# Patient Record
Sex: Female | Born: 1975 | Hispanic: Yes | Marital: Single | Smoking: Never smoker
Health system: Southern US, Community
[De-identification: ages and names within clinical notes are randomized; demographics above are authoritative.]

## PROBLEM LIST (undated history)

## (undated) DIAGNOSIS — E7800 Pure hypercholesterolemia, unspecified: Secondary | ICD-10-CM

## (undated) HISTORY — PX: MIDDLE EAR SURGERY: SHX713

---

## 2000-10-12 ENCOUNTER — Ambulatory Visit (HOSPITAL_COMMUNITY): Admission: RE | Admit: 2000-10-12 | Discharge: 2000-10-12 | Payer: Self-pay | Admitting: Infectious Diseases

## 2000-10-12 ENCOUNTER — Encounter: Admission: RE | Admit: 2000-10-12 | Discharge: 2000-10-12 | Payer: Self-pay | Admitting: Infectious Diseases

## 2000-10-12 ENCOUNTER — Encounter: Payer: Self-pay | Admitting: Infectious Diseases

## 2000-10-18 ENCOUNTER — Encounter: Admission: RE | Admit: 2000-10-18 | Discharge: 2000-10-18 | Payer: Self-pay | Admitting: Infectious Diseases

## 2014-03-02 ENCOUNTER — Emergency Department (HOSPITAL_COMMUNITY)
Admission: EM | Admit: 2014-03-02 | Discharge: 2014-03-03 | Disposition: A | Discharge status: Home / Self Care | Payer: Self-pay | Attending: Emergency Medicine | Admitting: Emergency Medicine

## 2014-03-02 ENCOUNTER — Encounter (HOSPITAL_COMMUNITY): Payer: Self-pay | Admitting: Emergency Medicine

## 2014-03-02 ENCOUNTER — Emergency Department (HOSPITAL_COMMUNITY): Payer: Self-pay

## 2014-03-02 DIAGNOSIS — R109 Unspecified abdominal pain: Secondary | ICD-10-CM | POA: Insufficient documentation

## 2014-03-02 DIAGNOSIS — Z862 Personal history of diseases of the blood and blood-forming organs and certain disorders involving the immune mechanism: Secondary | ICD-10-CM | POA: Insufficient documentation

## 2014-03-02 DIAGNOSIS — Z3202 Encounter for pregnancy test, result negative: Secondary | ICD-10-CM | POA: Insufficient documentation

## 2014-03-02 DIAGNOSIS — Z8639 Personal history of other endocrine, nutritional and metabolic disease: Secondary | ICD-10-CM | POA: Insufficient documentation

## 2014-03-02 DIAGNOSIS — R1012 Left upper quadrant pain: Secondary | ICD-10-CM | POA: Insufficient documentation

## 2014-03-02 HISTORY — DX: Pure hypercholesterolemia, unspecified: E78.00

## 2014-03-02 LAB — COMPREHENSIVE METABOLIC PANEL
ALT: 11 U/L (ref 0–35)
AST: 20 U/L (ref 0–37)
Albumin: 3.8 g/dL (ref 3.5–5.2)
Alkaline Phosphatase: 64 U/L (ref 39–117)
Anion gap: 12 (ref 5–15)
BUN: 8 mg/dL (ref 6–23)
CO2: 24 mEq/L (ref 19–32)
Calcium: 9.1 mg/dL (ref 8.4–10.5)
Chloride: 102 mEq/L (ref 96–112)
Creatinine, Ser: 0.63 mg/dL (ref 0.50–1.10)
GFR calc Af Amer: >90 mL/min (ref >90)
GFR calc non Af Amer: >90 mL/min (ref >90)
Glucose, Bld: 107 mg/dL — ABNORMAL HIGH (ref 70–99)
Potassium: 3.8 mEq/L (ref 3.7–5.3)
Sodium: 138 mEq/L (ref 137–147)
Total Bilirubin: 0.3 mg/dL (ref 0.3–1.2)
Total Protein: 8.2 g/dL (ref 6.0–8.3)

## 2014-03-02 LAB — CBC WITH DIFFERENTIAL/PLATELET
Basophils Absolute: 0 10*3/uL (ref 0.0–0.1)
Basophils Relative: 0 % (ref 0–1)
Eosinophils Absolute: 0.1 10*3/uL (ref 0.0–0.7)
Eosinophils Relative: 1 % (ref 0–5)
HCT: 37.1 % (ref 36.0–46.0)
Hemoglobin: 12.9 g/dL (ref 12.0–15.0)
Lymphocytes Relative: 38 % (ref 12–46)
Lymphs Abs: 2.9 10*3/uL (ref 0.7–4.0)
MCH: 31.5 pg (ref 26.0–34.0)
MCHC: 34.8 g/dL (ref 30.0–36.0)
MCV: 90.7 fL (ref 78.0–100.0)
Monocytes Absolute: 0.4 10*3/uL (ref 0.1–1.0)
Monocytes Relative: 5 % (ref 3–12)
Neutro Abs: 4.1 10*3/uL (ref 1.7–7.7)
Neutrophils Relative %: 56 % (ref 43–77)
Platelets: 293 10*3/uL (ref 150–400)
RBC: 4.09 MIL/uL (ref 3.87–5.11)
RDW: 12.8 % (ref 11.5–15.5)
WBC: 7.4 10*3/uL (ref 4.0–10.5)

## 2014-03-02 LAB — LIPASE, BLOOD: Lipase: 40 U/L (ref 11–59)

## 2014-03-02 MED ORDER — IOHEXOL 300 MG/ML  SOLN: 100.0000 mL | Freq: Once | INTRAMUSCULAR | Status: AC | PRN

## 2014-03-02 MED ORDER — IOHEXOL 300 MG/ML  SOLN: 50.0000 mL | Freq: Once | INTRAMUSCULAR | Status: AC | PRN

## 2014-03-02 MED ORDER — SODIUM CHLORIDE 0.9 % IJ SOLN
INTRAMUSCULAR | Status: AC
  Filled 2014-03-02: qty 200

## 2014-03-02 NOTE — ED Notes (Signed)
Patient has had intermittent L sided abdominal pain "for a while", but since last Thursday, the pain has been constant a constant pain, numbness and bloating in LUQ.  She also reports numbness around L side of mouth.  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  Occasional nausea, no vomiting or diarrhea. Reports chills and diaphoresis.

## 2014-03-02 NOTE — ED Notes (Signed)
Pt. Reports left upper quadrant pain. Pt. Denies nausea/vomiting/diarrhea. Pt. Reports increased pain with palpitation. No acute distress noted.

## 2014-03-02 NOTE — ED Notes (Signed)
Dr. Delo at bedside. 

## 2014-03-03 LAB — URINALYSIS, ROUTINE W REFLEX MICROSCOPIC
Bilirubin Urine: NEGATIVE
Glucose, UA: NEGATIVE mg/dL
NITRITE: NEGATIVE
Protein, ur: NEGATIVE mg/dL
SPECIFIC GRAVITY, URINE: 1.02 (ref 1.005–1.030)
UROBILINOGEN UA: 0.2 mg/dL (ref 0.0–1.0)
pH: 6 (ref 5.0–8.0)

## 2014-03-03 LAB — URINE MICROSCOPIC-ADD ON

## 2014-03-03 LAB — PREGNANCY, URINE: Preg Test, Ur: NEGATIVE

## 2014-03-03 MED ORDER — IOHEXOL 300 MG/ML  SOLN
50.0000 mL | Freq: Once | INTRAMUSCULAR | Status: AC | PRN
  Administered 2014-03-03: 50 mL via ORAL

## 2014-03-03 MED ORDER — OMEPRAZOLE 20 MG PO CPDR: 20.0000 mg | DELAYED_RELEASE_CAPSULE | Freq: Every day | ORAL | Status: AC

## 2014-03-03 MED ORDER — IOHEXOL 300 MG/ML  SOLN
100.0000 mL | Freq: Once | INTRAMUSCULAR | Status: AC | PRN
  Administered 2014-03-03: 100 mL via INTRAVENOUS

## 2014-03-03 NOTE — Discharge Instructions (Signed)
Prilosec as prescribed.  Return to the emergency department if you develop severe abdominal pain, high fever, bloody stool, or any other new and concerning symptom.   Dolor abdominal (Abdominal Pain) El dolor puede tener muchas causas. Normalmente la causa del dolor abdominal no es una enfermedad y Scientist, clinical (histocompatibility and immunogenetics) sin TEFL teacher. Frecuentemente puede controlarse y tratarse en casa. Su mdico le Medical sales representative examen fsico y posiblemente solicite anlisis de sangre y radiografas para ayudar a Chief Strategy Officer la gravedad de su dolor. Sin embargo, en [employer], debe transcurrir ms tiempo antes de que se pueda Clinical research associate una causa evidente del dolor. Antes de llegar a ese punto, es posible que su mdico no sepa si necesita ms pruebas o un tratamiento ms profundo. INSTRUCCIONES PARA EL CUIDADO EN EL HOGAR  Est atento al dolor para ver si hay cambios. Las siguientes indicaciones ayudarn a Architectural technologist que pueda sentir:  [place] solo medicamentos de venta libre o recetados, segn las indicaciones del mdico.  No tome laxantes a menos que se lo haya indicado su mdico.  Pruebe con Neomia Dear dieta lquida absoluta (caldo, t o agua) segn se lo indique su mdico. Introduzca gradualmente una dieta normal, segn su tolerancia. SOLICITE ATENCIN MDICA SI:  Tiene dolor abdominal sin explicacin.  Tiene dolor abdominal relacionado con nuseas o diarrea.  Tiene dolor cuando orina o defeca.  Experimenta dolor abdominal que lo despierta de noche.  Tiene dolor abdominal que empeora o mejora cuando come alimentos.  Tiene dolor abdominal que empeora cuando come alimentos grasosos.  Tiene fiebre. SOLICITE ATENCIN MDICA DE INMEDIATO SI:   El dolor no desaparece en un plazo mximo de 2horas.  No deja de (vomitar).  El Engineer, mining se siente solo en partes del abdomen, como el lado derecho o la parte inferior izquierda del abdomen.  Evaca materia fecal sanguinolenta o negra, de aspecto  alquitranado. ASEGRESE DE QUE:  Comprende estas instrucciones.  Controlar su afeccin.  Recibir ayuda de inmediato si no mejora o si empeora. Document Released: 06/01/2005 Document Revised: 06/06/2013 [facility] Patient Information 2015 [place], [place]. This information is not intended to replace advice given to you by your health care provider. Make sure you discuss any questions you have with your health care provider.

## 2014-03-03 NOTE — ED Provider Notes (Addendum)
CSN: [id number]     Arrival date & time 03/02/14  2238 History   First MD Initiated Contact with Patient 03/02/14 2259     Chief Complaint  Patient presents with  . Abdominal Pain     (Consider location/radiation/quality/duration/timing/severity/associated sxs/prior Treatment) HPI Comments: Patient presents with a three-week history of left upper quadrant pain. It has worsened over the past 24 hours. She denies any fevers or chills. She denies any urinary complaints.  Patient is a 38 y.o. female presenting with abdominal pain. The history is provided by the patient.  Abdominal Pain Pain location:  LUQ Pain quality: cramping   Pain radiates to:  Does not radiate Pain severity:  Moderate Onset quality:  Gradual Duration:  3 weeks Timing:  Constant Progression:  Worsening Chronicity:  New Relieved by:  Nothing Worsened by:  Nothing tried Ineffective treatments:  None tried   Past Medical History  Diagnosis Date  . Hypercholesterolemia    Past Surgical History  Procedure Laterality Date  . Middle ear surgery     History reviewed. No pertinent family history. History  Substance Use Topics  . Smoking status: Never Smoker   . Smokeless tobacco: Not on file  . Alcohol Use: No   OB History   Grav Para Term Preterm Abortions TAB SAB Ect Mult Living                 Review of Systems  Gastrointestinal: Positive for abdominal pain.  All other systems reviewed and are negative.     Allergies  Review of patient's allergies indicates no known allergies.  Home Medications   Prior to Admission medications   Not on File   BP 114/69  Pulse 72  Temp(Src) 98.4 F (36.9 C) (Oral)  Resp 16  Wt 125 lb (56.7 kg)  SpO2 99%  LMP 02/06/2014 Physical Exam  Nursing note and vitals reviewed. Constitutional: She is oriented to person, place, and time. She appears well-developed and well-nourished. No distress.  HENT:  Head: Normocephalic and atraumatic.  Neck: Normal range  of motion. Neck supple.  Cardiovascular: Normal rate and regular rhythm.  Exam reveals no gallop and no friction rub.   No murmur heard. Pulmonary/Chest: Effort normal and breath sounds normal. No respiratory distress. She has no wheezes.  Abdominal: Soft. Bowel sounds are normal. She exhibits no distension. There is tenderness. There is no rebound and no guarding.  There is tenderness to palpation in the left upper quadrant.  Musculoskeletal: Normal range of motion.  Neurological: She is alert and oriented to person, place, and time.  Skin: Skin is warm and dry. She is not diaphoretic.    ED Course  Procedures (including critical care time) Labs Review Labs Reviewed  COMPREHENSIVE METABOLIC PANEL - Abnormal; Notable for the following:    Glucose, Bld 107 (*)    All other components within normal limits  URINALYSIS, ROUTINE W REFLEX MICROSCOPIC - Abnormal; Notable for the following:    Hgb urine dipstick TRACE (*)    Ketones, ur TRACE (*)    Leukocytes, UA TRACE (*)    All other components within normal limits  URINE MICROSCOPIC-ADD ON - Abnormal; Notable for the following:    Squamous Epithelial / LPF FEW (*)    Bacteria, UA FEW (*)    All other components within normal limits  CBC WITH DIFFERENTIAL  LIPASE, BLOOD  PREGNANCY, URINE    Imaging Review Ct Abdomen Pelvis W Contrast  03/03/2014   CLINICAL DATA:  Left flank  pain for 5 days.  EXAM: CT ABDOMEN AND PELVIS WITH CONTRAST  TECHNIQUE: Multidetector CT imaging of the abdomen and pelvis was performed using the standard protocol following bolus administration of intravenous contrast.  CONTRAST:  50mL OMNIPAQUE IOHEXOL 300 MG/ML SOLN, OMNIPAQUE IOHEXOL 300 MG/ML SOLN  COMPARISON:  None.  FINDINGS: The visualized lung bases are clear.  The liver and spleen are unremarkable in appearance. The gallbladder is within normal limits. The pancreas and adrenal glands are unremarkable.  The kidneys are unremarkable in appearance. There  is no evidence of hydronephrosis. No renal or ureteral stones are seen. No perinephric stranding is appreciated.  No free fluid is identified. The small bowel is unremarkable in appearance. The stomach is within normal limits. No acute vascular abnormalities are seen.  The appendix is normal in caliber and contains air, without evidence for appendicitis. The colon is unremarkable in appearance.  The bladder is decompressed and not well assessed. The uterus is within normal limits. The ovaries are not well characterized. No suspicious adnexal masses are seen. No inguinal lymphadenopathy is seen.  No acute osseous abnormalities are identified. 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  IMPRESSION: 1. No acute abnormality seen within the abdomen or pelvis. 2. 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   Electronically Signed   By: Roanna Raider M.D.   On: 03/03/2014 00:42     EKG Interpretation None      MDM   Final diagnoses:  None    Patient is a 38 year old female with no significant past medical history. She presents with complaints of left upper quadrant pain that has been occurring intermittently for the past several weeks. Her discomfort has been worse for the past 24 hours and presents for evaluation of this. She denies any fevers or chills. She denies any relation to food. There are no aggravating or alleviating factors.  Workup reveals no elevation of white count, normal LFTs, unremarkable urinalysis, and CT scan which reveals no acute process. I am uncertain as to the exact etiology of her discomfort, however nothing appears emergent. This may well be gastritis and I will treat with a course of Prilosec and when necessary followup.   Geoffery Lyons, MD 03/03/14 1610  Geoffery Lyons, MD 03/15/14 [phone]

## 2018-12-12 ENCOUNTER — Other Ambulatory Visit: Payer: Self-pay | Admitting: Internal Medicine

## 2018-12-12 ENCOUNTER — Other Ambulatory Visit: Payer: Self-pay

## 2018-12-12 DIAGNOSIS — Z20822 Contact with and (suspected) exposure to covid-19: Secondary | ICD-10-CM

## 2018-12-15 ENCOUNTER — Telehealth: Payer: Self-pay

## 2018-12-15 LAB — NOVEL CORONAVIRUS, NAA: SARS-CoV-2, NAA: DETECTED — AB

## 2018-12-15 NOTE — Telephone Encounter (Signed)
Pt contacted via interpreter [phone]. Pt COVID-19 test was positive. She was infected with the germ and can pass the germ to others. Pt states that she has sore throat headache and some chest pain. Reviewed with patient all quarantine requirements. She verbalized understanding. She was advised to call 911 for severe chest pain and difficulty breathing. [facility] department Freddie Breech RN Clinical supervisor. Was contact with patient results and information.

## 2019-05-17 ENCOUNTER — Other Ambulatory Visit (HOSPITAL_COMMUNITY): Payer: Self-pay | Admitting: Nurse Practitioner

## 2019-05-17 DIAGNOSIS — Z1231 Encounter for screening mammogram for malignant neoplasm of breast: Secondary | ICD-10-CM

## 2019-05-29 ENCOUNTER — Other Ambulatory Visit: Payer: Self-pay

## 2019-05-29 ENCOUNTER — Ambulatory Visit (HOSPITAL_COMMUNITY)
Admission: RE | Admit: 2019-05-29 | Discharge: 2019-05-29 | Disposition: A | Discharge status: Home / Self Care | Payer: Self-pay | Source: Ambulatory Visit | Attending: Nurse Practitioner | Admitting: Nurse Practitioner

## 2019-05-29 DIAGNOSIS — Z1231 Encounter for screening mammogram for malignant neoplasm of breast: Secondary | ICD-10-CM | POA: Insufficient documentation

## 2020-04-09 IMAGING — MG DIGITAL SCREENING BILAT W/ TOMO W/ CAD
8 series · 8 of 24 positions shown · non-contrast
Comparison: Previous exam(s).

CLINICAL DATA: Screening.

EXAM:
DIGITAL SCREENING BILATERAL MAMMOGRAM WITH TOMO AND CAD

[L MLO synth-2D]
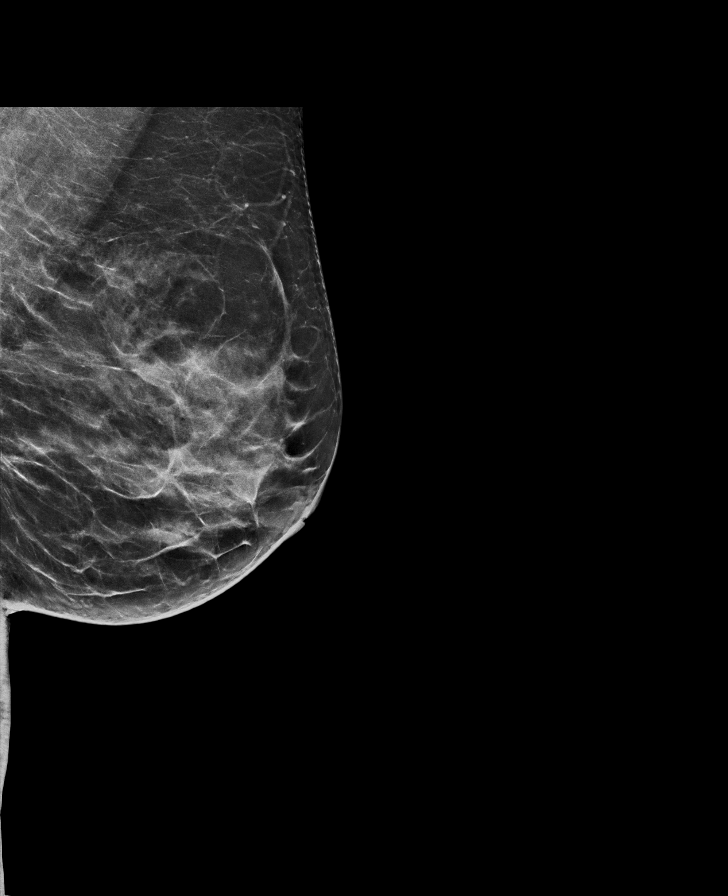

[R MLO synth-2D]
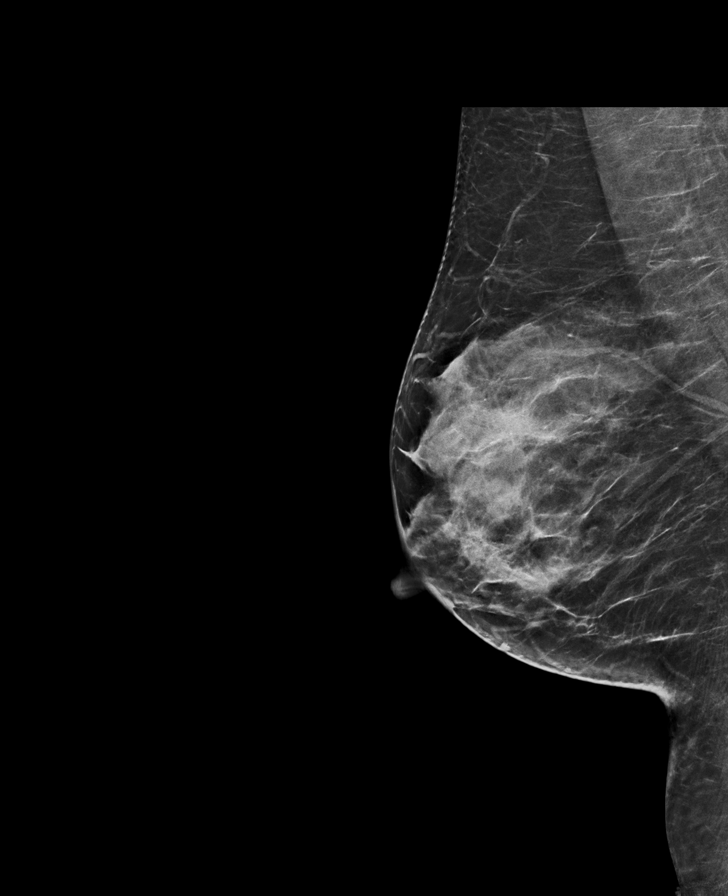

[L CC synth-2D]
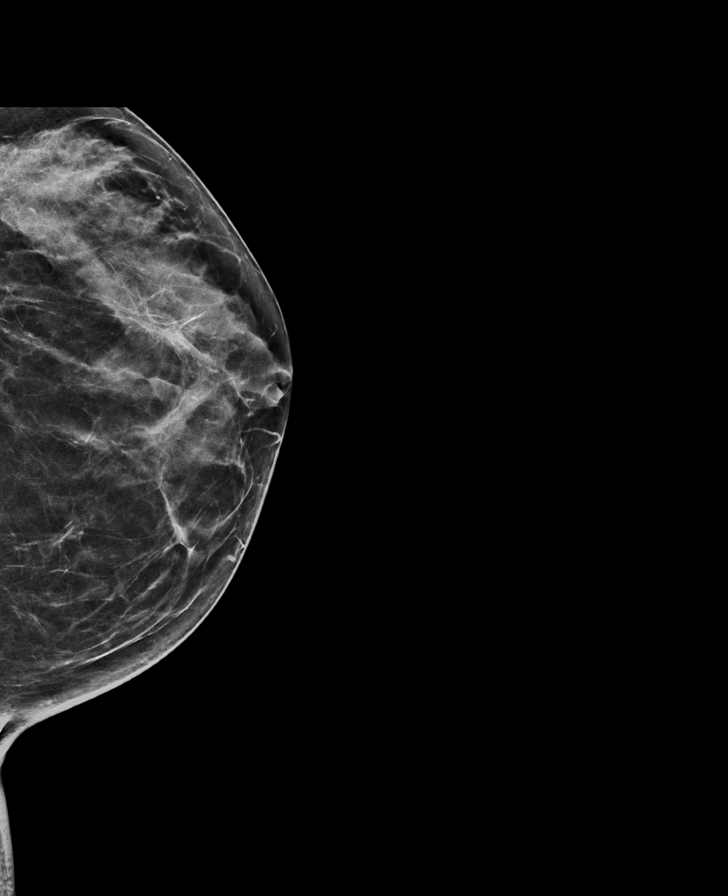

[R CC synth-2D]
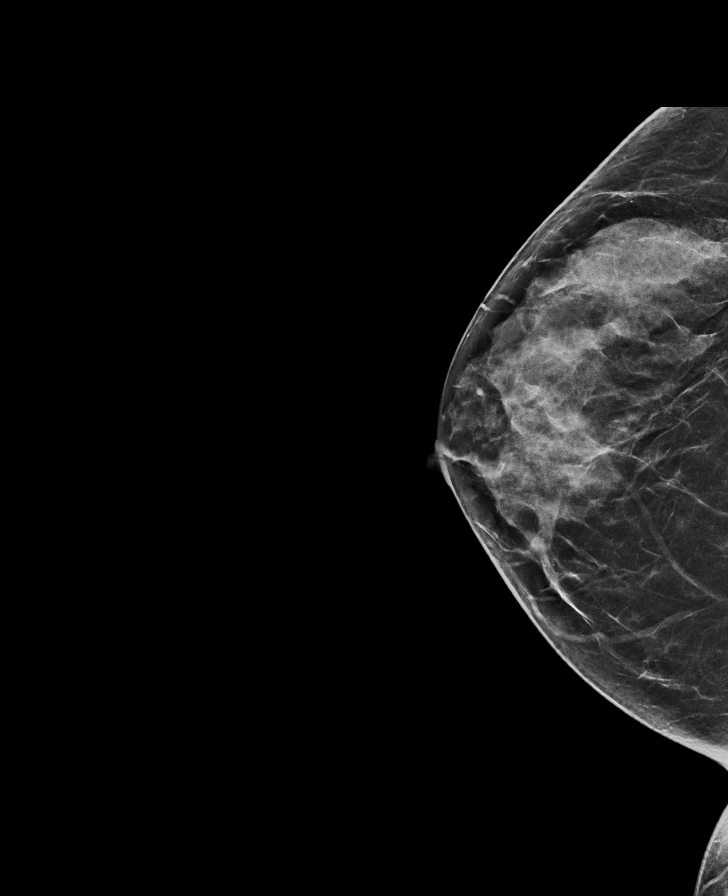

[R MLO tomo · tomo slice 33/65.0]
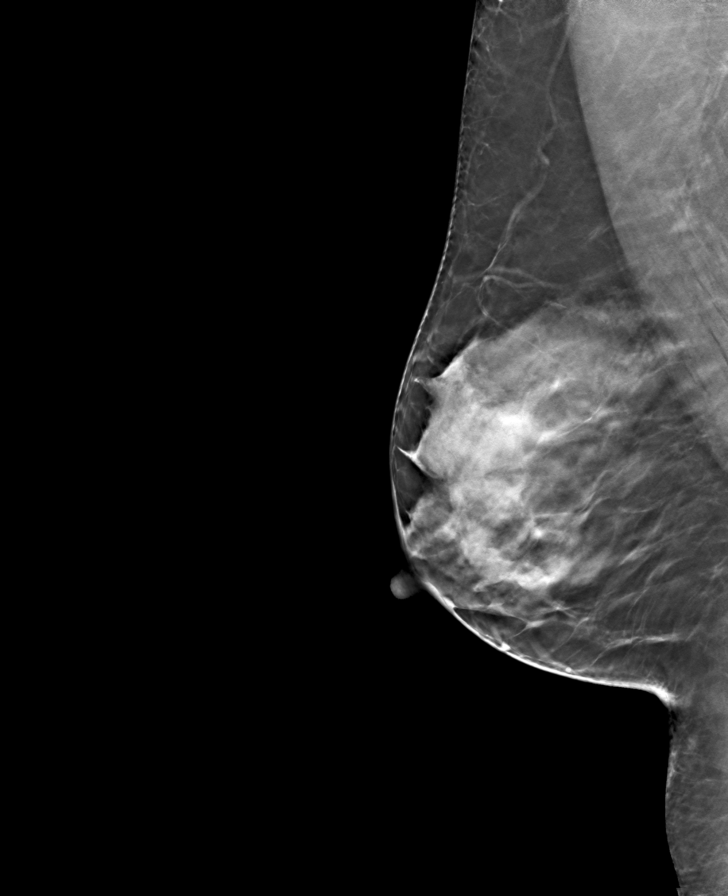

[L CC tomo · tomo slice 32/63.0]
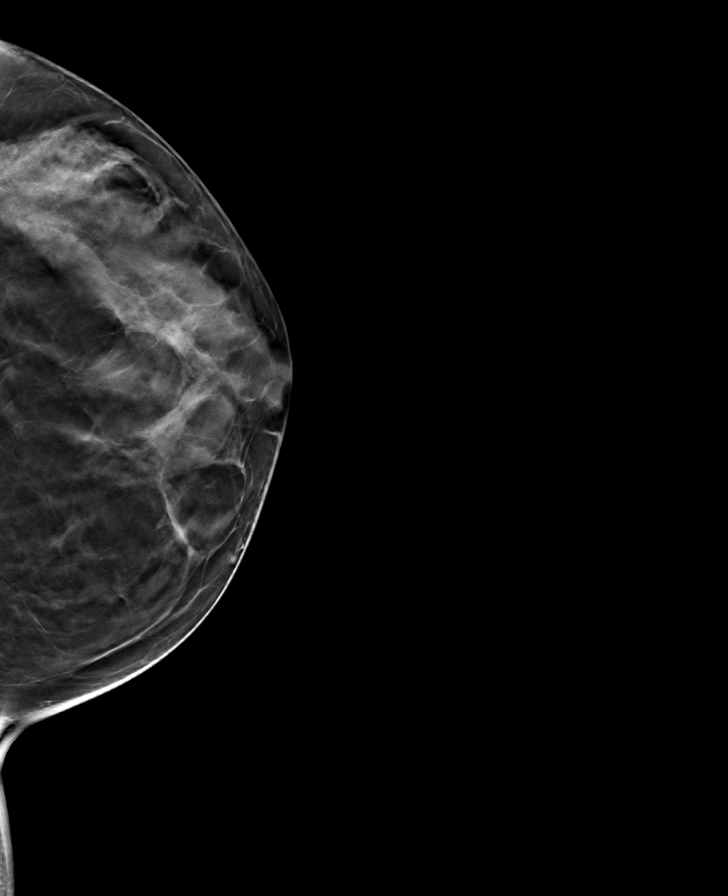

[R CC tomo · tomo slice 31/61.0]
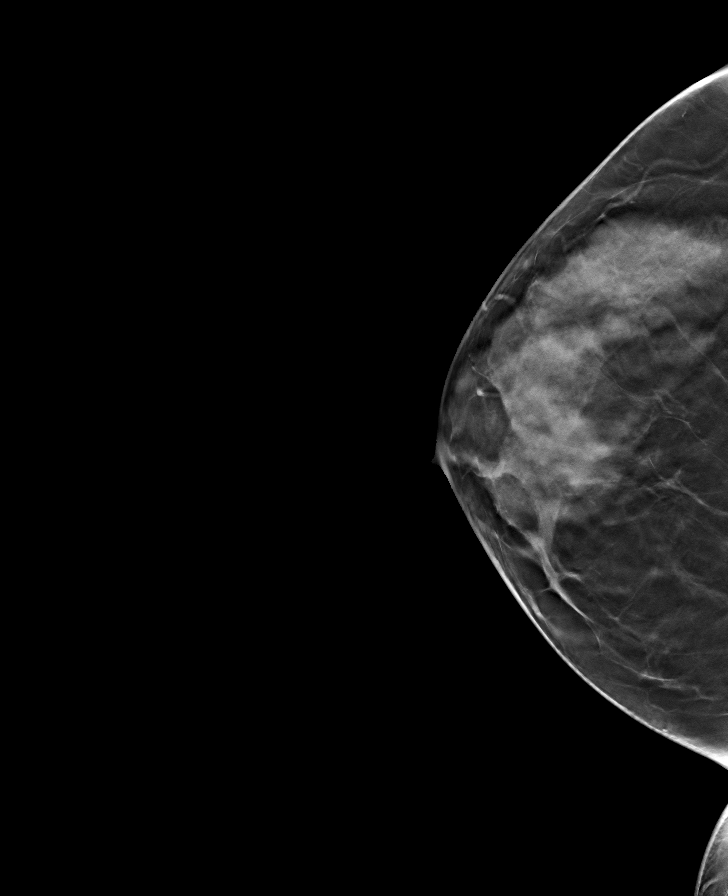

[L MLO tomo · tomo slice 33/65.0]
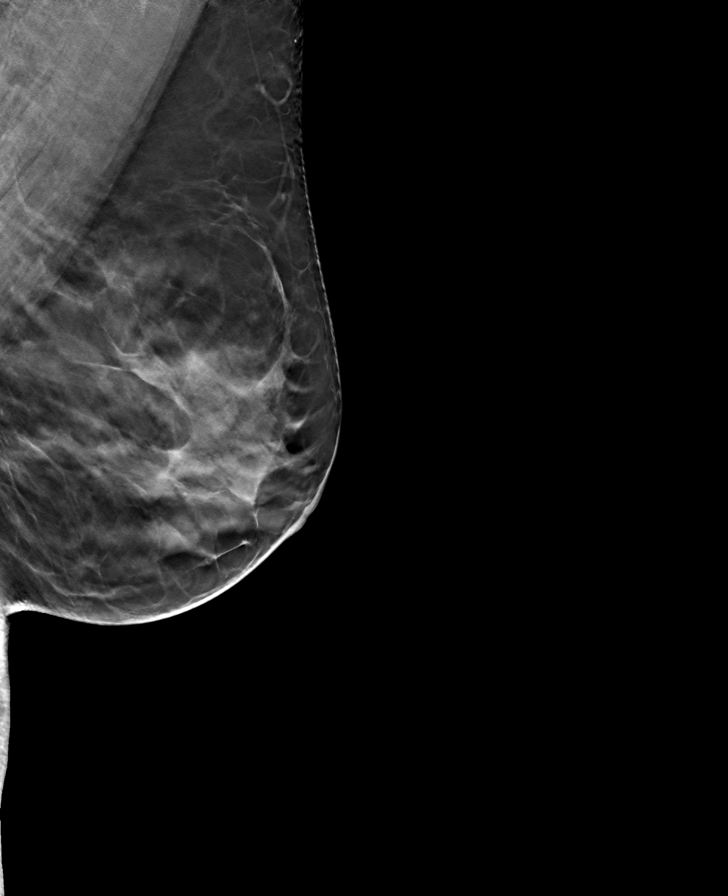

[8 of 24 positions shown; findings below may reference images not displayed]

ACR Breast Density Category c: The breast tissue is heterogeneously
dense, which may obscure small masses.
FINDINGS: There are no findings suspicious for malignancy. Images were
processed with CAD.
IMPRESSION: No mammographic evidence of malignancy. A result letter of this
screening mammogram will be mailed directly to the patient.

RECOMMENDATION:
Screening mammogram in one year. (Code:FT-U-LHB)

BI-RADS CATEGORY  1: Negative.

## 2020-11-13 ENCOUNTER — Other Ambulatory Visit (HOSPITAL_COMMUNITY): Payer: Self-pay | Admitting: Nurse Practitioner

## 2020-11-13 DIAGNOSIS — Z1231 Encounter for screening mammogram for malignant neoplasm of breast: Secondary | ICD-10-CM

## 2020-11-25 ENCOUNTER — Ambulatory Visit (HOSPITAL_COMMUNITY): Payer: Self-pay

## 2022-08-07 ENCOUNTER — Telehealth: Payer: Self-pay

## 2022-08-07 NOTE — Telephone Encounter (Signed)
Attempted to call patient using language line interpreter#[id number]. Voice mail unavailable at this time.

## 2023-12-13 ENCOUNTER — Telehealth: Payer: Self-pay

## 2023-12-13 NOTE — Telephone Encounter (Signed)
 Patient exceeded federal poverty guideline after completing screening process for BCCCP program. Spoke with [employer] and interpreter at [facility] Department.

## 2024-06-14 ENCOUNTER — Emergency Department (HOSPITAL_BASED_OUTPATIENT_CLINIC_OR_DEPARTMENT_OTHER)
Admission: EM | Admit: 2024-06-14 | Discharge: 2024-06-14 | Disposition: A | Discharge status: Home / Self Care | Payer: Worker's Compensation | Attending: Emergency Medicine | Admitting: Emergency Medicine

## 2024-06-14 ENCOUNTER — Emergency Department (HOSPITAL_BASED_OUTPATIENT_CLINIC_OR_DEPARTMENT_OTHER): Payer: Self-pay

## 2024-06-14 ENCOUNTER — Other Ambulatory Visit: Payer: Self-pay

## 2024-06-14 ENCOUNTER — Emergency Department (HOSPITAL_BASED_OUTPATIENT_CLINIC_OR_DEPARTMENT_OTHER): Payer: Worker's Compensation | Admitting: Radiology

## 2024-06-14 ENCOUNTER — Encounter (HOSPITAL_BASED_OUTPATIENT_CLINIC_OR_DEPARTMENT_OTHER): Payer: Self-pay

## 2024-06-14 DIAGNOSIS — W460XXA Contact with hypodermic needle, initial encounter: Secondary | ICD-10-CM | POA: Insufficient documentation

## 2024-06-14 DIAGNOSIS — S60552A Superficial foreign body of left hand, initial encounter: Secondary | ICD-10-CM | POA: Insufficient documentation

## 2024-06-14 DIAGNOSIS — Y990 Civilian activity done for income or pay: Secondary | ICD-10-CM | POA: Diagnosis not present

## 2024-06-14 MED ORDER — LIDOCAINE HCL (PF) 1 % IJ SOLN
10.0000 mL | Freq: Once | INTRAMUSCULAR | Status: AC
  Administered 2024-06-14: 10 mL
  Filled 2024-06-14: qty 10

## 2024-06-14 MED ORDER — BACITRACIN ZINC 500 UNIT/GM EX OINT
TOPICAL_OINTMENT | Freq: Two times a day (BID) | CUTANEOUS | Status: DC
  Filled 2024-06-14: qty 28.35

## 2024-06-14 MED ORDER — TETANUS-DIPHTH-ACELL PERTUSSIS 5-2-15.5 LF-MCG/0.5 IM SUSP
0.5000 mL | Freq: Once | INTRAMUSCULAR | Status: DC
  Filled 2024-06-14: qty 0.5

## 2024-06-14 NOTE — ED Provider Notes (Signed)
 " [place] EMERGENCY DEPARTMENT AT [facility] Provider Note   CSN: [id number] Arrival date & time: 06/14/24  1212     Patient presents with: Foreign Body in Skin and Work Related Injury   Carla Warren is a 48 y.o. female with right sided facial droop from previous injury presents to the ER today for evaluation of left hand injury. 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 She is unsure what kind of needle it was, but said there was a hook at the end. Reports some distal decrease in sensation, but to all fingers. Denies tingling. She is RHD. Denies any other injury. NKDA. Tetanus was updated 4 years ago per patient. Interpreter used during this encounter.   HPI     Prior to Admission medications  Medication Sig Start Date End Date Taking? Authorizing Provider  omeprazole  (PRILOSEC) 20 MG capsule Take 1 capsule (20 mg total) by mouth daily. 03/03/14   Geroldine Berg, MD    Allergies: Patient has no known allergies.    Review of Systems  Musculoskeletal:  Positive for arthralgias.    Updated Vital Signs BP 124/72 (BP Location: Right Arm)   Pulse 70   Temp 98.2 F (36.8 C) (Oral)   Resp 20   Ht 4' 10.66 (1.49 m)   Wt 68 kg   SpO2 98%   BMI 30.65 kg/m   Physical Exam Vitals and nursing note reviewed.  Constitutional:      General: She is not in acute distress.    Appearance: She is not ill-appearing or toxic-appearing.  Eyes:     General: No scleral icterus. Pulmonary:     Effort: Pulmonary effort is normal. No respiratory distress.  Musculoskeletal:        General: Signs of injury present.     Comments: Metallic foreign body protruding from the skin just proximal to the third MCP on the left hand.  Please see image.  Brisk cap refill present in all 5 fingers.  Palpable radial pulse.  Patient reports sensation is intact and symmetric in all 5 fingers as well.  Full flexion  extension of the wrist and fingers without difficulty.  Skin:    General: Skin is warm and dry.  Neurological:     Mental Status: She is alert.     (all labs ordered are listed, but only abnormal results are displayed) Labs Reviewed - No data to display  EKG: None  Radiology: DG Hand 2 View Left Result Date: 06/14/2024 CLINICAL DATA:  Status post foreign body removal. EXAM: LEFT HAND - 2 VIEW COMPARISON:  Radiograph earlier today FINDINGS: The previous linear foreign body has been totally removed. No residual foreign body. Mild soft tissue edema persists at the foreign body site over the dorsum of the hand. No acute osseous abnormalities. IMPRESSION: Previous needle has been removed in full, no residual foreign body. Electronically Signed   By: Andrea Gasman M.D.   On: 06/14/2024 16:40   DG Hand 2 View Left Result Date: 06/14/2024 EXAM: 1 OR 2 VIEW(S) XRAY OF THE LEFT HAND 06/14/2024 12:52:00 PM COMPARISON: None available. CLINICAL HISTORY: foreign body FINDINGS: BONES AND JOINTS: No acute fracture. No malalignment. SOFT TISSUES: Metallic needle in dorsal aspect of superficial soft tissues of the hand. Dressing in place. IMPRESSION: 1. Metallic needle within the dorsal aspect of the superficial soft tissues of the left hand. Dressing in place. Electronically signed by:  Waddell Calk MD 06/14/2024 01:35 PM EST RP Workstation: [id number]   .Foreign Body Removal  Date/Time: 06/14/2024 4:00 PM  Performed by: Bernis Ernst, PA-C Authorized by: Bernis Ernst, PA-C  Consent: Verbal consent obtained Risks and benefits: risks, benefits and alternatives were discussed Consent given by: patient Patient understanding: patient states understanding of the procedure being performed Imaging studies: imaging studies available Patient identity confirmed: verbally with patient Intake: left hand. Anesthesia: local infiltration  Anesthesia: Local Anesthetic: lidocaine 1% without epinephrine 1  objects recovered. Objects recovered: needle Post-procedure assessment: foreign body removed Patient tolerance: patient tolerated the procedure well with no immediate complications     Medications Ordered in the ED  bacitracin ointment (has no administration in time range)  lidocaine (PF) (XYLOCAINE) 1 % injection 10 mL (10 mLs Other Given by Other 06/14/24 1528)                                Medical Decision Making Amount and/or Complexity of Data Reviewed Radiology: ordered.  Risk OTC drugs. Prescription drug management.   48 y.o. female presents to the ER for evaluation of hand injury. Differential diagnosis includes but is not limited to retained foreign body, soft tissue damage, tendon ligament damage, fracture. Vital signs mildly elevated blood pressure otherwise unremarkable. Physical exam as noted above.   XR hand 1. Metallic needle within the dorsal aspect of the superficial soft tissues of the left hand. Dressing in place. Per radiologist's interpretation.     Please see procedure note. Patient was prepped with iodine.  Around 2.5 cc of lidocaine used locally to the area.  Negative aspiration of blood.  Anesthesia achieved.  Used hemostats to gentle steady pressure and was able to remove the needle after making the initial puncture hole mildly bigger with an 11 blade.  Please see picture removed foreign body.  X-ray obtained again to rule out any retained foreign body however does appear that the Bolding was removed.  The needle does have a poke to the initial top of it however I think this may have broken off prior to her injury of her hand.  I do not see this present in the hand.   New x-ray per my personal interpretation does not show any retained metal foreign body.  Will place bacitracin ointment and bandage over the area.  Discussed wound cleaning with patient at bedside.  Do not feel she needs antibiotics given it was only present for a few hours and was not  contaminated.  She is not immunocompromise.  Tetanus was update 4 years ago. Wound care discussed.   We discussed the results of the labs/imaging. The plan is . We discussed strict return precautions and red flag symptoms. The patient verbalized their understanding and agrees to the plan. The patient is stable and being discharged home in good condition.  Portions of this report may have been transcribed using voice recognition software. Every effort was made to ensure accuracy; however, inadvertent computerized transcription errors may be present.    Final diagnoses:  Foreign body of left hand, initial encounter    ED Discharge Orders     None          Bernis Ernst, [place] 06/14/24 1655    Lenor Hollering, MD 06/14/24 2125  "

## 2024-06-14 NOTE — Discharge Instructions (Signed)
 You were seen in the Emergency Department today for evaluation of your hand. It appears that the metal was removed. Please make sure that you are remembering to keep your wound clean daily with Dial soap and water and daily bandage changes. I recommend keeping the wound covered for the next 48-72 hours and then to your comfort afterwards. Do not expose the wound to any dishwater, pools, lakes, oceans, [employer], dirt or grime. Keeping the wound clean and away from contamination can help ensure good wound healing and help to prevent infections. For pain, I recommend Tylenol 1000mg  and/or ibuprofen 600mg  every 6 hours as needed for pain. Let your PCP know that we updated your tetanus shot for you today. If you have any concerns, new or worsening symptoms, please return to the nearest ER for re-evaluation.   ------------------------  Rush le atendimos en el servicio de urgencias para evaluar su mano. Al parecer, se le extrajo el objeto metlico. Recuerde mantener la herida limpia a diario con jabn Dial y agua, y air cabin crew vendaje todos [place]. Le recomiendo kimber[place]clark la herida cubierta durante las prximas 48 a 72 horas y, posteriormente, segn su comodidad. Evite exponer la herida a agua sucia, piscinas, lagos, ocanos, jacuzzis, tierra o suciedad. Mantener la herida limpia y libre de contaminacin ayuda a una buena cicatrizacin y previene infecciones. Para el dolor, le recomiendo tomar 1000 mg de Tylenol y/o 600 mg de ibuprofeno cada 6 horas, segn sea necesario. Informe a su mdico de cabecera que hoy le administramos la vacuna antitetnica. Si tiene alguna duda, o si presenta sntomas nuevos o que [place], acuda al servicio de urgencias ms cercano para una nueva evaluacin.  Comunquese con un mdico si: Tiene un cuerpo extrao que no sale o que no fue extrado. Tiene ms enrojecimiento, hinchazn o dolor alrededor de la zona de la herida o incisin. La zona de la herida o incisin supura ms lquido o  sangre. La zona de la herida o incisin se siente caliente al tacto. Usted advierte un olor ftido o pus que proviene de la zona de la herida o incisin. Tiene fiebre. Le aplicaron la antitetnica y tiene hinchazn, dolor intenso, enrojecimiento o hemorragia en el sitio de la inyeccin. Solicite ayuda de inmediato si: La zona de la herida o incisin se entumece. No puede usar las e. i. du pont.

## 2024-06-14 NOTE — ED Triage Notes (Addendum)
 Pt has hooked needle stuck from knitting machine in L hand.

## 2024-06-16 ENCOUNTER — Telehealth (HOSPITAL_BASED_OUTPATIENT_CLINIC_OR_DEPARTMENT_OTHER): Payer: Self-pay | Admitting: Emergency Medicine
